# Patient Record
Sex: Female | Born: 2000 | Race: Black or African American | Hispanic: No | Marital: Single | Smoking: Never smoker
Health system: Southern US, Community
[De-identification: ages and names within clinical notes are randomized; demographics above are authoritative.]

## PROBLEM LIST (undated history)

## (undated) DIAGNOSIS — G43909 Migraine, unspecified, not intractable, without status migrainosus: Secondary | ICD-10-CM

## (undated) DIAGNOSIS — R519 Headache, unspecified: Secondary | ICD-10-CM

## (undated) DIAGNOSIS — R51 Headache: Secondary | ICD-10-CM

## (undated) DIAGNOSIS — K259 Gastric ulcer, unspecified as acute or chronic, without hemorrhage or perforation: Secondary | ICD-10-CM

## (undated) DIAGNOSIS — U071 COVID-19: Secondary | ICD-10-CM

## (undated) DIAGNOSIS — L309 Dermatitis, unspecified: Secondary | ICD-10-CM

## (undated) DIAGNOSIS — B052 Measles complicated by pneumonia: Secondary | ICD-10-CM

## (undated) DIAGNOSIS — K5900 Constipation, unspecified: Secondary | ICD-10-CM

## (undated) HISTORY — PX: NO PAST SURGERIES: SHX2092

## (undated) HISTORY — DX: Constipation, unspecified: K59.00

## (undated) HISTORY — DX: Measles complicated by pneumonia: B05.2

## (undated) HISTORY — DX: Headache, unspecified: R51.9

## (undated) HISTORY — DX: Migraine, unspecified, not intractable, without status migrainosus: G43.909

## (undated) HISTORY — DX: Headache: R51

## (undated) HISTORY — DX: Gastric ulcer, unspecified as acute or chronic, without hemorrhage or perforation: K25.9

---
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
## 2020-07-01 IMAGING — CT CT ABD-PELV W/ CM
2 of 4 series · 16 of 46 positions shown, 18 images · IV contrast (APPLIED)
Comparison: None.

CLINICAL DATA: 18-year-old female with acute abdominal pain and
blood in stool.

EXAM:
CT ABDOMEN AND PELVIS WITH CONTRAST
TECHNIQUE: Multidetector CT imaging of the abdomen and pelvis was performed
using the standard protocol following bolus administration of
intravenous contrast.
CONTRAST:  100mL OMNIPAQUE IOHEXOL 300 MG/ML  SOLN

[Series 3: abd/ pelvis 5.0 i30f 2 · axial · 0.83mm/px · z∈[+731,+1176]mm · 13 of 99 slices shown, 15 images]
[im 5/99  soft-tissue]
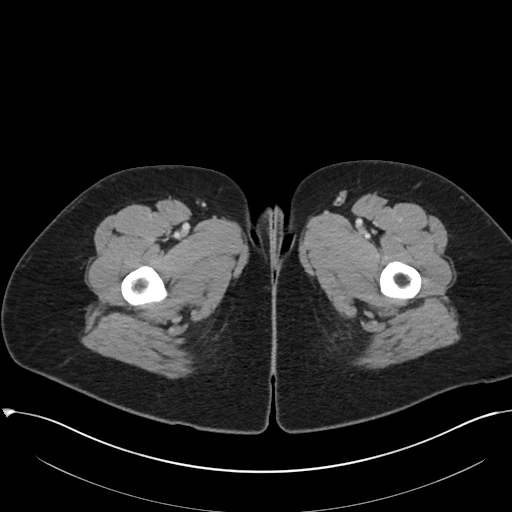
[im 5/99  bone]
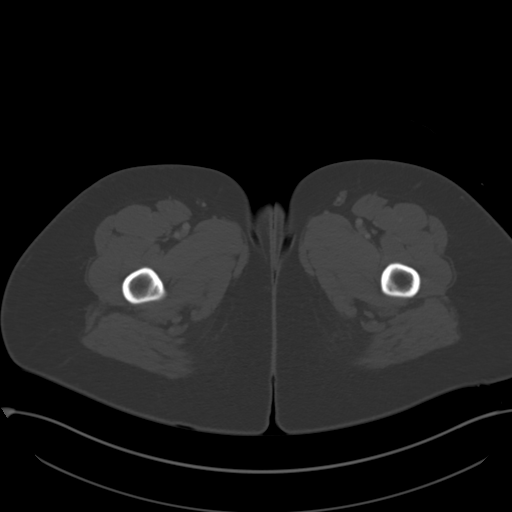
[im 13/99  soft-tissue]
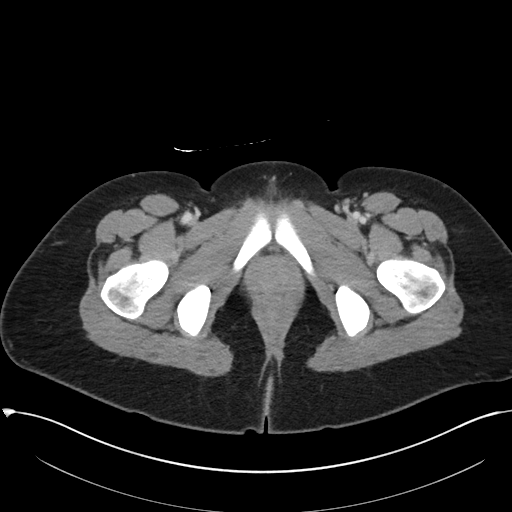
[im 22/99  soft-tissue]
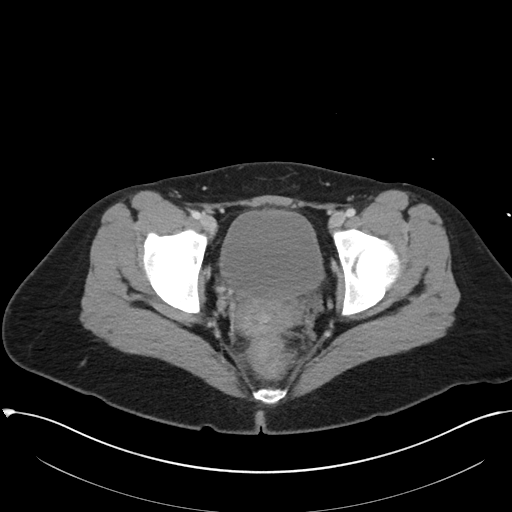
[im 26/99  soft-tissue]
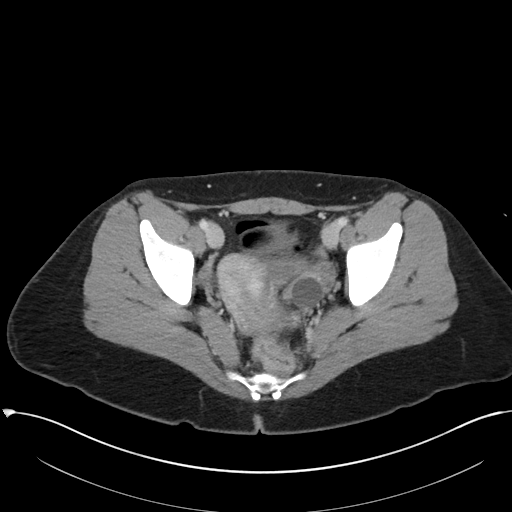
[im 35/99  soft-tissue]
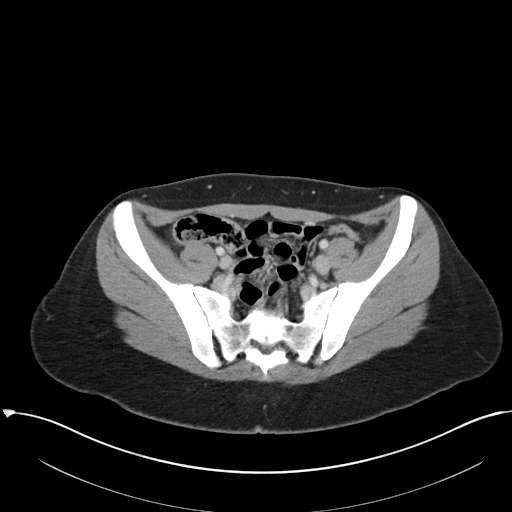
[im 43/99  soft-tissue]
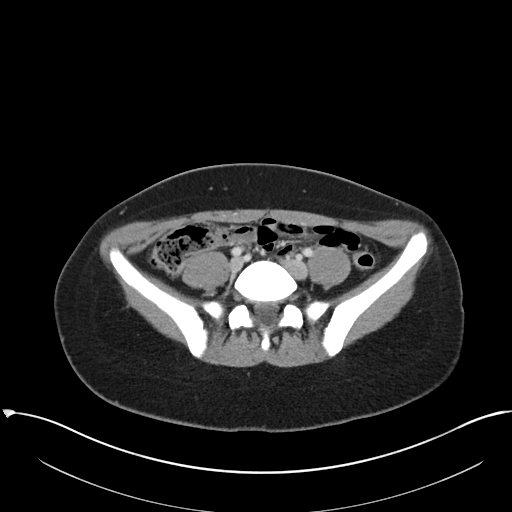
[im 52/99  soft-tissue]
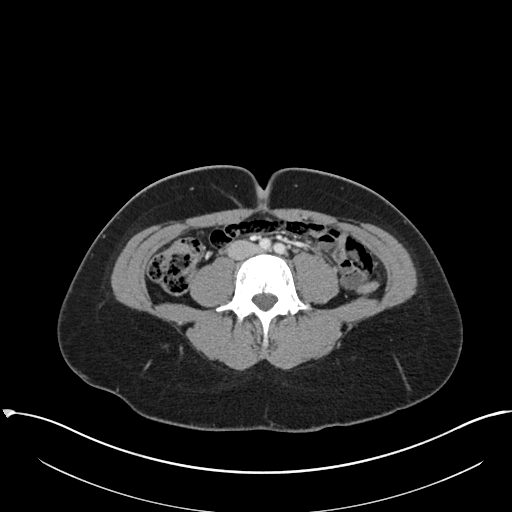
[im 56/99  soft-tissue]
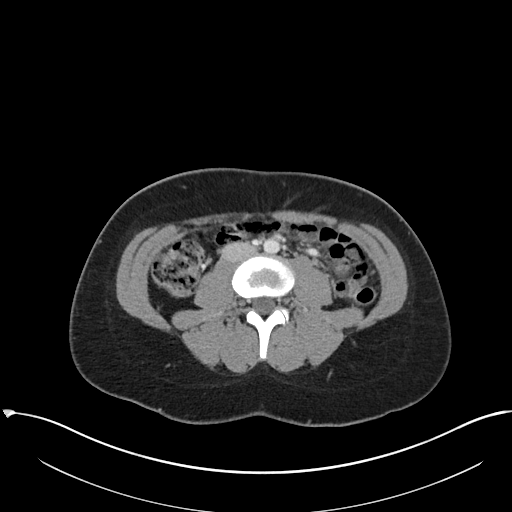
[im 64/99  soft-tissue]
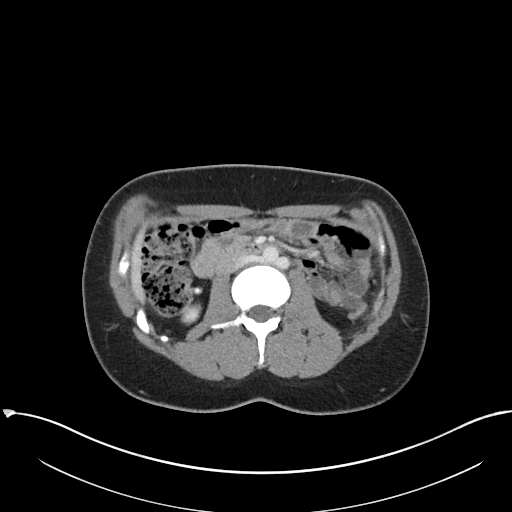
[im 64/99  bone]
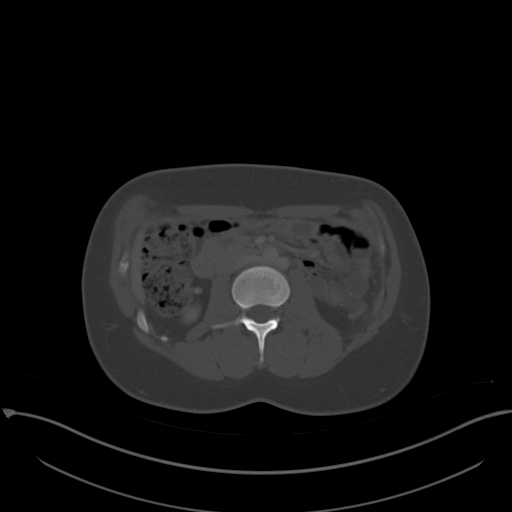
[im 73/99  soft-tissue]
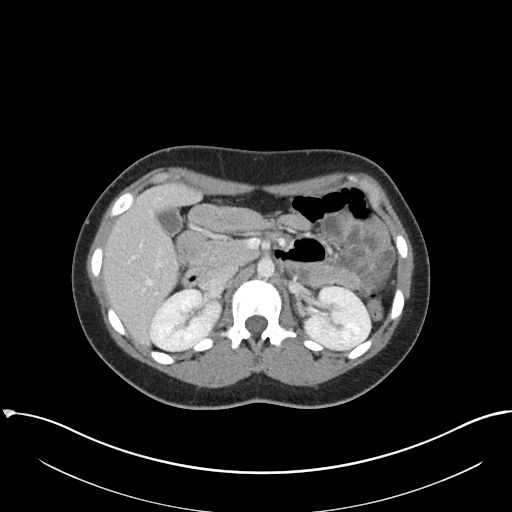
[im 77/99  soft-tissue]
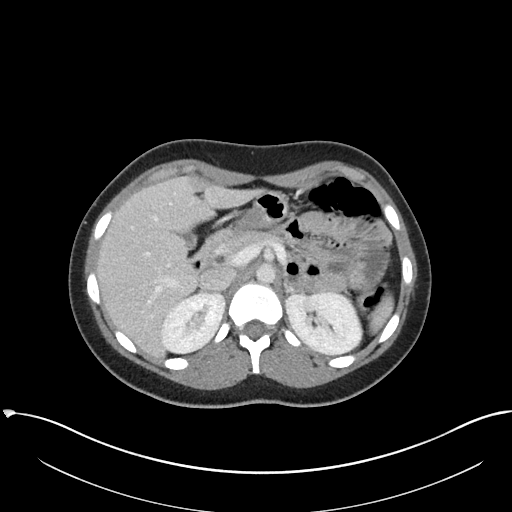
[im 86/99  soft-tissue]
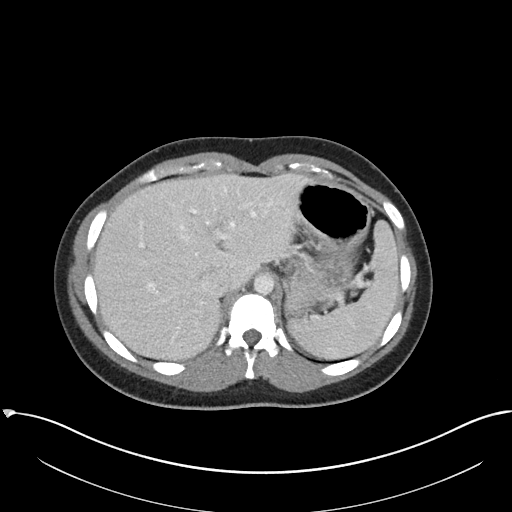
[im 94/99  soft-tissue]
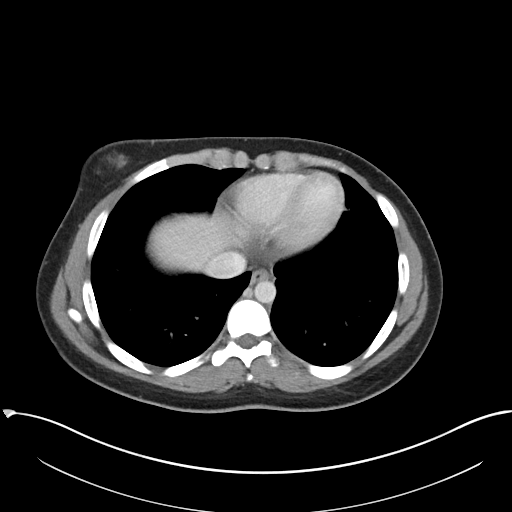

[Series 6: coronal soft tissue · coronal · 0.67mm/px · 3 of 77 slices shown]
[im 26/77  soft-tissue]
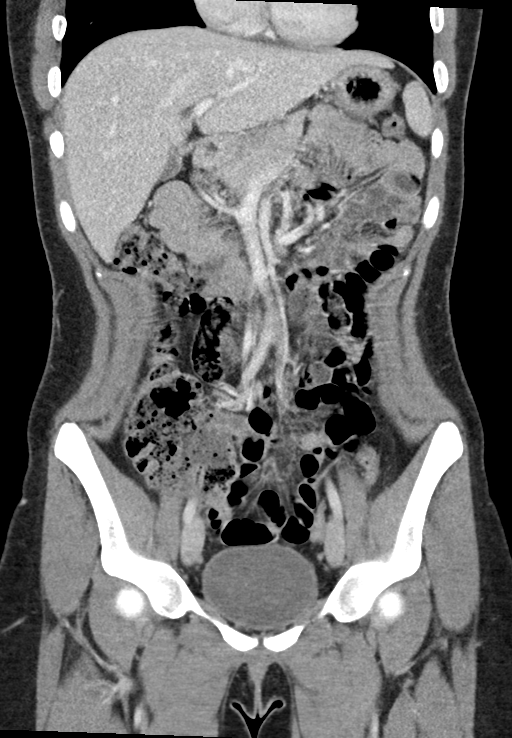
[im 34/77  soft-tissue]
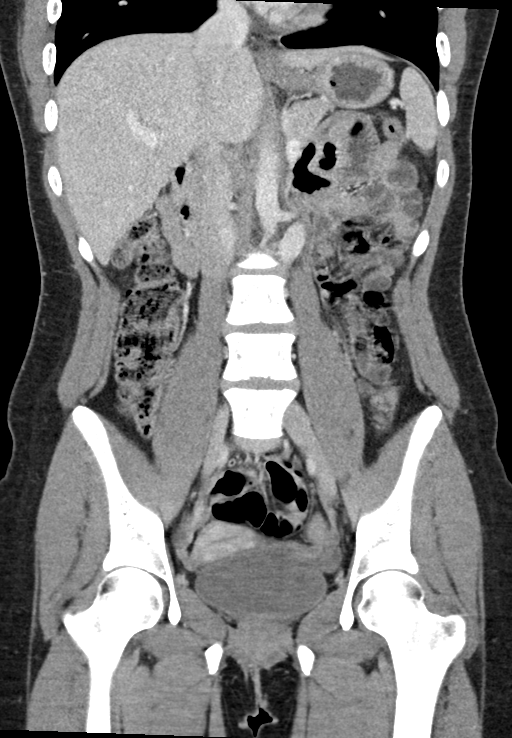
[im 43/77  soft-tissue]
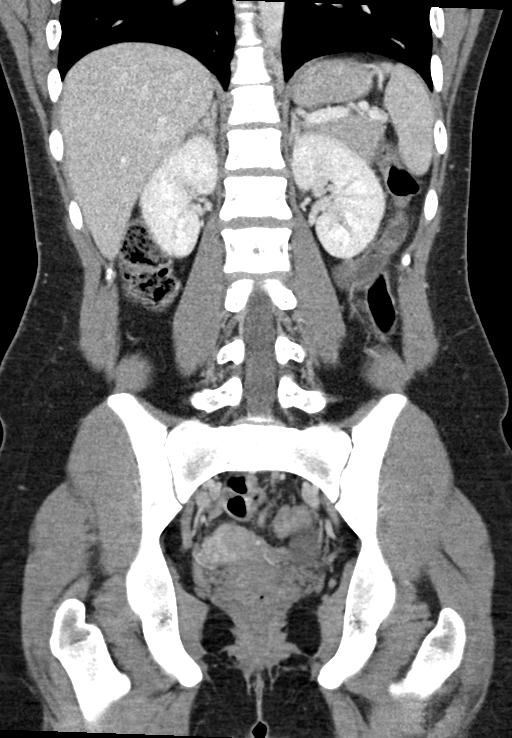

[16 of 46 positions shown; findings below may reference images not displayed]

FINDINGS: Lower chest: Unremarkable

Hepatobiliary: The liver and gallbladder are unremarkable. No
biliary dilatation.

Pancreas: Unremarkable

Spleen: Unremarkable

Adrenals/Urinary Tract: The kidneys, adrenal glands and bladder are
unremarkable.

Stomach/Bowel: Stomach is within normal limits. Appendix appears
normal. No evidence of bowel wall thickening, distention, or
inflammatory changes.

Vascular/Lymphatic: No significant vascular findings are present. No
enlarged abdominal or pelvic lymph nodes.

Reproductive: Uterus and bilateral adnexa are unremarkable.

Other: No ascites, abscess or pneumoperitoneum.

Musculoskeletal: No acute or significant abnormalities noted.
IMPRESSION: Unremarkable CT of the abdomen and pelvis with contrast.

## 2020-07-12 ENCOUNTER — Ambulatory Visit: Payer: Medicaid Other

## 2021-01-11 ENCOUNTER — Ambulatory Visit (HOSPITAL_COMMUNITY)
Admission: EM | Admit: 2021-01-11 | Discharge: 2021-01-11 | Disposition: A | Discharge status: Home / Self Care | Payer: Medicaid Other | Attending: Psychiatry | Admitting: Psychiatry

## 2021-01-11 ENCOUNTER — Other Ambulatory Visit: Payer: Self-pay

## 2021-01-11 DIAGNOSIS — J80 Acute respiratory distress syndrome: Secondary | ICD-10-CM | POA: Diagnosis not present

## 2021-01-11 DIAGNOSIS — R000 Tachycardia, unspecified: Secondary | ICD-10-CM | POA: Diagnosis not present

## 2021-01-11 DIAGNOSIS — R064 Hyperventilation: Secondary | ICD-10-CM | POA: Diagnosis not present

## 2021-01-11 DIAGNOSIS — F331 Major depressive disorder, recurrent, moderate: Secondary | ICD-10-CM | POA: Insufficient documentation

## 2021-01-11 DIAGNOSIS — F411 Generalized anxiety disorder: Secondary | ICD-10-CM | POA: Insufficient documentation

## 2021-01-11 DIAGNOSIS — R0689 Other abnormalities of breathing: Secondary | ICD-10-CM | POA: Diagnosis not present

## 2021-01-11 MED ORDER — HYDROXYZINE PAMOATE 25 MG PO CAPS: 25.0000 mg | ORAL_CAPSULE | Freq: Three times a day (TID) | ORAL | 0 refills | Status: DC | PRN

## 2021-01-11 MED ORDER — HYDROXYZINE HCL 25 MG PO TABS
25.0000 mg | ORAL_TABLET | Freq: Three times a day (TID) | ORAL | Status: DC | PRN
  Filled 2021-01-11: qty 21

## 2021-01-11 NOTE — ED Notes (Signed)
Pt discharged in no acute distress. Verbalized understanding of resources and medication samples given upon discharge.

## 2021-01-11 NOTE — Discharge Instructions (Signed)
° °  Please come to [facility] (this facility) during walk in hours for appointment with psychiatrist for further medication management and for therapists for therapy.  ° ° Walk in hours are 8-11 AM Monday through Thursday for medication management.Child and adolescent psychiatrists are only available on Wednesdays and Thursdays during walk in hours.  °Therapy walk in hours are Monday-Wednesday 8 AM-1PM.   It is first come, first -serve; it is best to arrive by 7:00 AM.  ° °On Friday from 1 pm to 4 pm for therapy intake only. Please arrive by 12:00 pm as it is  first come, first -serve.   ° °When you arrive please go upstairs for your appointment. If you are unsure of where to go, inform the front desk that you are here for a walk in appointment and they will assist you with directions upstairs. ° °Address:  °[address], in [place], [address] °Ph: [phone]  ° °

## 2021-01-11 NOTE — ED Provider Notes (Signed)
[facility] Medical Screening Exam  Patient Name: Shari Baker MRN: [id number] Date of Evaluation: 01/11/21 Chief Complaint:   Diagnosis:  Final diagnoses:  GAD (generalized anxiety disorder)  Moderate episode of recurrent major depressive disorder (HCC)    History of Present illness: Shari Baker is a 20 y.o. female with history of anxiety and depression who presents with her sister for evaluation of anxiety, depression, and panic attacks. Patient states that she presented to the [facility] today because " I'm having anxiety attacks". She state that this has been going on for 2 months, she denies any new or recent stressors in this time frame. She states that she completed a 4 year degree in 2 years and that there was some issues with her possibly having missed a class and not being able to graduate on time ; however, that has been resolved and she will graduate on time. She will graduate from [employer] with a degree in Counselling psychologist and will start a job in July. The job will be in [place] but she will be able to work remote  and will live with her sister who presented here with her today in [place]. She plans to reside in [place] until she graduates. She reports doing well in school. Pt reports that anxiety has recently been severe and that her mood as been depressed. She reports decreased appetite and has lost 30 lbs in the last 1 year. She reports hopelessness,decreased energy, isolation, anhedonia. She states that her grandmother passed away in 2021-05-22and that several other family members have passed away since then. She has not been to a Camera operator. She does not have a therapist or a psychiatrist. She states that her neurologist prescribed her venlafaxine which she has only took for 2 days prior to discontinuing because of side effects. She reports panic attacks occur frequently, will try and do breathing exercises but not always successful. She reports some feelings of passive  SI at times  but no plan or intent. No SI, active or passive reported during assessment. No HI/AVH. She reports looking forward to starting her job. Sister who is present with her reports no safety concerns.   Discussed open access hours at the [facility] as she is currently a [place] resident and has medicaid-she is interested in medication management and therapy. Discussed starting PRN vistaril 25 mg for anxiety- provided with 7 day sample and 30 day rx sent to pharmacy of choice.   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, civil (consulting) Ed: no Housing Status: with mom and dad Easy access to gun: no  Substance Use (with emphasis over the last 12 months) Recreational Drugs: denied Use of Alcohol: denied Tobacco Use: no Rehab History: no H/O Complicated Withdrawal: no  Legal History: Past Charges/Incarcerations: no Pending charges: no  Family Psychiatric History: Sister with anxiety   Psychiatric Specialty Exam  Presentation  General Appearance:Appropriate for Environment; Casual  Eye Contact:Fair  Speech:Clear and Coherent; Normal Rate  Speech Volume:Normal  Handedness:No data recorded  Mood and Affect  Mood:Anxious  Affect:Appropriate; Congruent   Thought Process  Thought Processes:Coherent; Goal Directed; Linear  Descriptions of Associations:Intact  Orientation:Full (Time, Place and Person)  Thought Content:WDL    Hallucinations:None  Ideas of Reference:None  Suicidal Thoughts:No  Homicidal Thoughts:No   Sensorium  Memory:Immediate  Good; Recent Good; Remote Good  Judgment:Fair  Insight:Fair   Executive Functions  Concentration:Good  Attention Span:Good  Recall:Good  Fund  of Knowledge:Good  Language:Good   Psychomotor Activity  Psychomotor Activity:Normal   Assets  Assets:Communication Skills; Desire for Improvement; Vocational/Educational; Social Support; Resilience; Housing   Sleep  Sleep:Poor (inconsistent; anywhere from 3 hours to "all day")  Number of hours: No data recorded  No data recorded  Physical Exam: Physical Exam Constitutional:      Appearance: Normal appearance. She is normal weight.  HENT:     Head: Normocephalic and atraumatic.  Pulmonary:     Effort: Pulmonary effort is normal.  Neurological:     Mental Status: She is alert and oriented to person, place, and time.    Review of Systems  Constitutional: Negative for chills and fever.  HENT: Negative for hearing loss.   Eyes: Negative for discharge and redness.  Respiratory: Negative for cough.   Cardiovascular: Negative for chest pain.  Gastrointestinal: Negative for abdominal pain.  Musculoskeletal: Negative for myalgias.  Neurological: Positive for headaches.   Blood pressure 115/79, pulse 81, temperature 98.7 F (37.1 C), temperature source Oral, resp. rate 16, SpO2 100 %. There is no height or weight on file to calculate BMI.  Musculoskeletal: Strength & Muscle Tone: within normal limits Gait & Station: normal Patient leans: N/A   [facility] MSE Discharge Disposition for Follow up and Recommendations: Based on my evaluation the patient does not appear to have an emergency medical condition and can be discharged with resources and follow up care in outpatient services for Medication Management and Individual Therapy   Patient provided with 7 day samples of vistaril 25 mg PRN TID for anxiety and sent prescription for 30 day rx to pharmacy of choice   Estella Husk, MD 01/11/2021, 3:30 PM

## 2021-01-11 NOTE — Progress Notes (Signed)
Shari Baker : ROUTINE: Patient is a 20 year old female that presents this date with her sister [name] [phone]. Patient denies any S/I, H/I or AVH. Patient was diagnosed with depression/anxiety over a year ago and prescribed medications at that time although patient cannot recall the name of that medication. Patient states she discontinued that medication two days due to side effects. Patient reports since then she has been having sporadic panic attacks (once or two a week for the last six months) with last episode yesterday when she contacted EMS due to SOB, feeling overwhelmed and extreme anxiety. Patient denies any prior attempts to self harm or SA issues. Patient reports decreased sleep for the last year although states she has only been sleeping 4 hours or less for the last month. Patient is requesting to be evaluated for possible medication interventions for symptom management.

## 2021-01-15 ENCOUNTER — Ambulatory Visit (HOSPITAL_COMMUNITY): Payer: Medicaid Other | Admitting: Licensed Clinical Social Worker

## 2021-01-16 ENCOUNTER — Ambulatory Visit (INDEPENDENT_AMBULATORY_CARE_PROVIDER_SITE_OTHER): Payer: Medicaid Other | Admitting: Physician Assistant

## 2021-01-16 ENCOUNTER — Other Ambulatory Visit: Payer: Self-pay

## 2021-01-16 ENCOUNTER — Encounter (HOSPITAL_COMMUNITY): Payer: Self-pay | Admitting: Physician Assistant

## 2021-01-16 VITALS — BP 118/52 | HR 97 | Ht 69.0 in | Wt 153.0 lb

## 2021-01-16 DIAGNOSIS — F331 Major depressive disorder, recurrent, moderate: Secondary | ICD-10-CM | POA: Diagnosis not present

## 2021-01-16 DIAGNOSIS — F411 Generalized anxiety disorder: Secondary | ICD-10-CM | POA: Insufficient documentation

## 2021-01-16 DIAGNOSIS — F410 Panic disorder [episodic paroxysmal anxiety] without agoraphobia: Secondary | ICD-10-CM

## 2021-01-16 MED ORDER — FLUOXETINE HCL 10 MG PO CAPS: 10.0000 mg | ORAL_CAPSULE | Freq: Every day | ORAL | 1 refills | Status: DC

## 2021-01-16 NOTE — Progress Notes (Signed)
Psychiatric Initial Adult Assessment   Patient Identification: Shari Baker MRN:  [id number] Date of Evaluation:  01/16/2021 Referral Source: [facility] Chief Complaint:   Chief Complaint    New Patient (Initial Visit)     Visit Diagnosis:    ICD-10-CM   1. Moderate episode of recurrent major depressive disorder (HCC)  F33.1 FLUoxetine (PROZAC) 10 MG capsule  2. Generalized anxiety disorder  F41.1 FLUoxetine (PROZAC) 10 MG capsule  3. Panic disorder  F41.0 FLUoxetine (PROZAC) 10 MG capsule    History of Present Illness:    Shari Baker is a 20 year old female with a past psychiatric history significant for anxiety and depression who [facility].  Patient reports that she is trying to receive medications for the management of her anxiety and depressive symptoms.  She reports that she has been doing some research on SSRIs and is interested in being placed on fluoxetine.  Patient reports that she was placed on an antidepressant a year ago but experienced terrible side effects and was taken to the emergency room.  The medication the patient was placed on was Effexor.  The side effects she experienced were racing thoughts, pacing and holding her head, intractable crying, hyperactivity, and the thought that she was going crazy.  Patient was taken off Effexor shortly after being admitted to the hospital.  Patient reports that she has been experiencing anxiety for roughly 2 months.  Patient reports that she also experienced a panic attack a month ago.  Patient's panic attacks are characterized by bad chest pains, shortness of breath, heart pounding, and racing thoughts.  Triggers to her anxiety include thinking about her stressors and planning out her future.  Patient rates her current anxiety a 9 out of 10.  Patient's stressors include school (patient was recently taking 21 credit hours), recent  acquisition of a new job, and some relationship issues.  [facility] after experiencing a bad panic attack and was prescribed Hydroxyzine 25 mg three times daily as needed for the management of her anxiety.  Patient feels that she has recently changed in regards to her personality.  Patient states that she used to be social and outgoing but now she has lost interest in specific activities and doesn't go out anymore. Patient also expresses newly developing test anxiety.  Patient endorses the following depressive symptoms: depressed mood, self-isolation, fluctuating sleep, lack of energy, lack of motivation, decreased appetite, and weight loss.  Alleviating factors to patient's symptoms include talking to someone in her family, watching uplifting TV shows, and stepping out for fresh air.  A GAD-7 screen was performed with the patient scoring a teen.  A PHQ-9 screen was also performed with the patient scoring a 21.  Patient is pleasant, calm, cooperative, and fully engaged in conversation during the encounter.  Patient denies suicidal or homicidal ideations.  She further denies auditory or visual hallucinations.  Patient endorses fluctuating sleep and states that sometimes she may receive a fair amount of sleep (4 to 5 hours) or a lot of sleep.  Patient endorses decreased appetite and states that she eats roughly 1 meal per day.  Patient denies alcohol consumption, tobacco use, and illicit drug use.   Associated Signs/Symptoms: Depression Symptoms:  depressed mood, anhedonia, insomnia, hypersomnia, psychomotor agitation, psychomotor retardation, fatigue, feelings of worthlessness/guilt, difficulty concentrating, hopelessness, impaired memory, anxiety, panic attacks, loss of energy/fatigue, disturbed sleep, weight loss, decreased appetite, (Hypo) Manic  Symptoms:  Distractibility, Flight of Ideas, Licensed conveyancer, Irritable Mood, Labiality  of Mood, Anxiety Symptoms:  Agoraphobia, Excessive Worry, Panic Symptoms, Social Anxiety, Specific Phobias, Psychotic Symptoms:  Paranoia, PTSD Symptoms: Had a traumatic exposure:  Patient reports the loss of many family members. Patient states that she was closed to many of these family members. Patient states that she has blocked some memories. Had a traumatic exposure in the last month:  N/A Re-experiencing:  Flashbacks Intrusive Thoughts Nightmares Hypervigilance:  No Hyperarousal:  Emotional Numbness/Detachment Irritability/Anger Sleep Avoidance:  Decreased Interest/Participation Foreshortened Future  Past Psychiatric History:  Anxiety Depression  Previous Psychotropic Medications: Yes   Substance Abuse History in the last 12 months:  No.  Consequences of Substance Abuse: NA  Past Medical History:  Past Medical History:  Diagnosis Date  . Constipation   . COVID-19   . Eczema   . Gastric ulcer   . Headache   . Measles complicated by pneumonia ~ 51 months of age   in [place], hospitalized x 2 weeks  . Migraine     Past Surgical History:  Procedure Laterality Date  . NO PAST SURGERIES      Family Psychiatric History:  Mother - patient believes that her mother has depression that has not been diagnosed Sister - Depression and anxiety Brother - OCD  Family History:  Family History  Problem Relation Age of Onset  . Hypercholesterolemia Mother   . Hypertension Mother   . Thyroid disease Mother   . Hypercholesterolemia Father   . Prostate cancer Maternal Grandfather   . Diabetes Paternal Grandmother   . Diabetes Paternal Grandfather   . Diabetes Maternal Uncle   . Diabetes Maternal Aunt   . Migraines Neg Hx   . Seizures Neg Hx   . Depression Neg Hx   . Anxiety disorder Neg Hx   . ADD / ADHD Neg Hx   . Autism Neg Hx   . Bipolar disorder Neg Hx   . Schizophrenia Neg Hx   . Stomach cancer Neg Hx   . Colon cancer Neg Hx   . Pancreatic cancer Neg Hx      Social History:   Social History   Socioeconomic History  . Marital status: Single    Spouse name: Not on file  . Number of children: 0  . Years of education: student  . Highest education level: Not on file  Occupational History  . Occupation: Consulting civil engineer  Tobacco Use  . Smoking status: Never Smoker  . Smokeless tobacco: Never Used  Vaping Use  . Vaping Use: Never used  Substance and Sexual Activity  . Alcohol use: Never  . Drug use: Never  . Sexual activity: Never  Other Topics Concern  . Not on file  Social History Narrative   She lives with her parents and siblings.    No caffeine use.   Right-handed.   Social Determinants of Health   Financial Resource Strain: Not on file  Food Insecurity: Not on file  Transportation Needs: Not on file  Physical Activity: Not on file  Stress: Not on file  Social Connections: Not on file    Additional Social History:    Allergies:  No Known Allergies  Metabolic Disorder Labs: No results found for: HGBA1C, MPG No results found for: PROLACTIN No results found for: CHOL, TRIG, HDL, CHOLHDL, VLDL, LDLCALC No results found for: TSH  Therapeutic Level Labs: No results found for: LITHIUM No results found for: CBMZ No results found for: VALPROATE  Current Medications: Current Outpatient Medications  Medication Sig Dispense Refill  . cetirizine (ZYRTEC) 10 MG tablet Take 1 tablet (10 mg total) by mouth daily. 30 tablet 0  . FLUoxetine (PROZAC) 10 MG capsule Take 1 capsule (10 mg total) by mouth daily. 30 capsule 1  . hydrOXYzine (VISTARIL) 25 MG capsule Take 1 capsule (25 mg total) by mouth 3 (three) times daily as needed for anxiety. 90 capsule 0  . mefloquine (LARIAM) 250 MG tablet Take 1 tablet (250 mg total) by mouth every 7 (seven) days. 9 tablet 0  . pantoprazole (PROTONIX) 20 MG tablet Take 1 tablet (20 mg total) by mouth daily. 30 tablet 1  . sucralfate (CARAFATE) 1 g tablet Take 1 tablet (1 g total) by mouth 4 (four)  times daily -  with meals and at bedtime for 14 days. 56 tablet 0   No current facility-administered medications for this visit.    Musculoskeletal: Strength & Muscle Tone: within normal limits Gait & Station: normal Patient leans: N/A  Psychiatric Specialty Exam: Review of Systems  Psychiatric/Behavioral: Positive for decreased concentration and sleep disturbance. Negative for dysphoric mood, hallucinations, self-injury and suicidal ideas. The patient is nervous/anxious. The patient is not hyperactive.     Blood pressure (!) 118/52, pulse 97, height 5\' 9"  (1.753 m), weight 153 lb (69.4 kg), SpO2 100 %.Body mass index is 22.59 kg/m.  General Appearance: Well Groomed  Eye Contact:  Good  Speech:  Clear and Coherent and Normal Rate  Volume:  Normal  Mood:  Anxious and Depressed  Affect:  Congruent and Depressed  Thought Process:  Coherent and Goal Directed  Orientation:  Full (Time, Place, and Person)  Thought Content:  WDL and Logical  Suicidal Thoughts:  No  Homicidal Thoughts:  No  Memory:  Immediate;   Good Recent;   Good Remote;   Good  Judgement:  Good  Insight:  Good  Psychomotor Activity:  Normal  Concentration:  Concentration: Good and Attention Span: Good  Recall:  Good  Fund of Knowledge:Good  Language: Good  Akathisia:  NA  Handed:  Right  AIMS (if indicated):  not done  Assets:  Communication Skills Desire for Improvement Housing Social Support Vocational/Educational  ADL's:  Intact  Cognition: WNL  Sleep:  Fair   Screenings: GAD-7   Flowsheet Row Office Visit from [facility]/16/2021 in [facility]  Total GAD-7 Score 19    PHQ2-9   Flowsheet Row Office Visit from 01/16/2021 in [place]  PHQ-2 Total Score 6  PHQ-9 Total Score 21    Flowsheet Row Office Visit from 01/16/2021 in [facility]  C-SSRS RISK CATEGORY No Risk      Assessment and Plan:   Chariah Zaugg is a  20 year old female with a past psychiatric history significant for anxiety and depression who presents to [facility] for psychiatric evaluation.  Patient is interested in being placed on an antidepressant for the management of her anxiety and depressive symptoms.  Patient endorses the following depressive symptoms: depressed mood, lack of interest in activities, self-isolation, disturbed sleep, lack of energy, lack of motivation, decreased appetite, and weight loss.  Patient also endorses panic attacks and states that she was assessed at [facility] recently after experiencing a very bad panic attack.  Patient was placed on hydroxyzine 25 mg 3 times daily as needed after being assessed at [facility].  Patient was recommended being placed on fluoxetine 10 mg daily  for the management of her anxiety, panic disorder, and depressive symptoms.  Patient was agreeable to recommendation.  Patient was advised to look out for the following symptoms upon starting the medication: Hyperactivity, racing thoughts, intractable crying spells, and insomnia. Patient was instructed to reach out to 911 or [facility] services in the event of experiencing manic symptoms. Patient's medications to be e-prescribed to pharmacy of choice.  1. Moderate episode of recurrent major depressive disorder (HCC)  - FLUoxetine (PROZAC) 10 MG capsule; Take 1 capsule (10 mg total) by mouth daily.  Dispense: 30 capsule; Refill: 1  2. Generalized anxiety disorder Patient to continue taking hydroxyzine 25 mg 3 times daily as needed for the management of her anxiety  - FLUoxetine (PROZAC) 10 MG capsule; Take 1 capsule (10 mg total) by mouth daily.  Dispense: 30 capsule; Refill: 1  3. Panic disorder  - FLUoxetine (PROZAC) 10 MG capsule; Take 1 capsule (10 mg total) by mouth daily.  Dispense: 30 capsule; Refill: 1  Patient to follow-up in 6 weeks  Meta Hatchet, PA 5/11/20228:58 PM

## 2021-01-17 ENCOUNTER — Encounter (HOSPITAL_COMMUNITY): Payer: Self-pay | Admitting: Physician Assistant

## 2021-01-23 DIAGNOSIS — R0981 Nasal congestion: Secondary | ICD-10-CM | POA: Diagnosis not present

## 2021-01-23 DIAGNOSIS — R059 Cough, unspecified: Secondary | ICD-10-CM | POA: Diagnosis not present

## 2021-01-23 DIAGNOSIS — Z20822 Contact with and (suspected) exposure to covid-19: Secondary | ICD-10-CM | POA: Diagnosis not present

## 2021-01-23 DIAGNOSIS — J0100 Acute maxillary sinusitis, unspecified: Secondary | ICD-10-CM | POA: Diagnosis not present

## 2021-03-01 ENCOUNTER — Encounter (HOSPITAL_COMMUNITY): Payer: Medicaid Other | Admitting: Physician Assistant

## 2021-03-26 ENCOUNTER — Telehealth (HOSPITAL_COMMUNITY): Payer: Self-pay | Admitting: *Deleted

## 2021-03-26 ENCOUNTER — Other Ambulatory Visit (HOSPITAL_COMMUNITY): Payer: Self-pay | Admitting: Physician Assistant

## 2021-03-26 DIAGNOSIS — F331 Major depressive disorder, recurrent, moderate: Secondary | ICD-10-CM

## 2021-03-26 DIAGNOSIS — F411 Generalized anxiety disorder: Secondary | ICD-10-CM

## 2021-03-26 DIAGNOSIS — F410 Panic disorder [episodic paroxysmal anxiety] without agoraphobia: Secondary | ICD-10-CM

## 2021-03-26 MED ORDER — FLUOXETINE HCL 10 MG PO CAPS: 10.0000 mg | ORAL_CAPSULE | Freq: Every day | ORAL | 1 refills | Status: DC

## 2021-03-26 NOTE — Progress Notes (Signed)
Provider was contacted by Direce E McIntyre, RMA regarding medication refill. Patient's medication to be e-prescribed to pharmacy of choice.

## 2021-03-26 NOTE — Telephone Encounter (Signed)
Provider was contacted by Direce E McIntyre, RMA regarding medication refill. Patient's medication to be e-prescribed to pharmacy of choice.

## 2021-03-26 NOTE — Telephone Encounter (Signed)
Rx REFILL REQUEST : FLUoxetine (PROZAC) 10 MG capsule

## 2021-04-04 ENCOUNTER — Telehealth (HOSPITAL_COMMUNITY): Payer: Medicaid Other | Admitting: Physician Assistant

## 2021-04-08 ENCOUNTER — Telehealth (INDEPENDENT_AMBULATORY_CARE_PROVIDER_SITE_OTHER): Payer: Medicaid Other | Admitting: Physician Assistant

## 2021-04-08 DIAGNOSIS — F411 Generalized anxiety disorder: Secondary | ICD-10-CM | POA: Diagnosis not present

## 2021-04-08 DIAGNOSIS — F410 Panic disorder [episodic paroxysmal anxiety] without agoraphobia: Secondary | ICD-10-CM

## 2021-04-08 DIAGNOSIS — F331 Major depressive disorder, recurrent, moderate: Secondary | ICD-10-CM

## 2021-04-08 MED ORDER — FLUOXETINE HCL 20 MG PO CAPS: 20.0000 mg | ORAL_CAPSULE | Freq: Every day | ORAL | 1 refills | Status: DC

## 2021-04-08 MED ORDER — HYDROXYZINE PAMOATE 25 MG PO CAPS: 25.0000 mg | ORAL_CAPSULE | Freq: Three times a day (TID) | ORAL | 1 refills | Status: DC | PRN

## 2021-04-08 NOTE — Progress Notes (Signed)
BH MD/PA/NP OP Progress Note  Virtual Visit via Telephone Note  I connected with Shari Baker on 04/10/21 at  3:30 PM EDT by telephone and verified that I am speaking with the correct person using two identifiers.  Location: Patient: Home Provider: Working remotely   I discussed the limitations, risks, security and privacy concerns of performing an evaluation and management service by telephone and the availability of in person appointments. I also discussed with the patient that there may be a patient responsible charge related to this service. The patient expressed understanding and agreed to proceed.  Follow Up Instructions:  I discussed the assessment and treatment plan with the patient. The patient was provided an opportunity to ask questions and all were answered. The patient agreed with the plan and demonstrated an understanding of the instructions.   The patient was advised to call back or seek an in-person evaluation if the symptoms worsen or if the condition fails to improve as anticipated.  I provided 25 minutes of non-face-to-face time during this encounter.  Meta Hatchet, PA    04/08/2021 4:48 PM Shari Baker  MRN:  [id number]  Chief Complaint: Follow up and medication management  HPI:   Shari Baker is a 20 year old female with a past psychiatric history significant major depressive disorder, generalized anxiety disorder, and panic disorder who presents to [facility] via virtual telephone visit for follow-up and medication management.  Patient is currently being managed on the following medications:  Fluoxetine 10 mg daily Hydroxyzine 25 mg 3 times daily as needed  Patient reports that she has been taking her medications routinely.  She expresses that her Prozac keeps her up at night stating that she sometimes stays up until 6 AM.  Patient was recommended taking her Prozac during the daytime.  Patient reports that her mood was pretty  good while taking Prozac up until 2 weeks ago.  Patient states that she is starting to reexperience panic attacks and has noticed that she is self isolating more.  Patient reports the following depressive symptoms: lack of motivation, depressed mood, and self-isolation.  Patient rates her anxiety at 3 out of 10 but states that it was much worse the previous week.  Patient states that she has also noticed a change in her speech.  Patient notes that she is speaking fast and starting to stutter.  She expresses that changes in her speech often occur with worsening anxiety.  Patient also endorses racing thoughts but denies hyperactivity, insomnia, or delusions of grandeur.  Patient attributes her anxiety and depressive symptoms to recently acquiring a new job in [place].  Patient reports that she will soon start having to move her stuff over to her new area.  Patient noted an improvement in her mood when visiting her sister.  A PHQ-9 screen was performed with the patient scoring a 16.  A GAD-7 screen was also performed with the patient scoring a 19.  Patient is calm, cooperative, and fully engaged in conversation during the encounter.  Patient denies suicidal or homicidal ideations.  She further denies auditory or visual hallucinations and does not appear to be responding to internal/external stimuli.  Patient endorses fair sleep and receives on average 7 hours of intermittent sleep.  Patient endorses change in appetite stating that she eats 1 meal with some occasional snacking.  Patient denies alcohol consumption, tobacco use, and illicit drug use.  Visit Diagnosis:    ICD-10-CM   1. Moderate episode of recurrent major depressive disorder (  HCC)  F33.1 FLUoxetine (PROZAC) 20 MG capsule    2. Generalized anxiety disorder  F41.1 hydrOXYzine (VISTARIL) 25 MG capsule    FLUoxetine (PROZAC) 20 MG capsule    3. Panic disorder  F41.0 FLUoxetine (PROZAC) 20 MG capsule      Past Psychiatric History:  Major depressive  disorder Generalized anxiety disorder Panic disorder  Past Medical History:  Past Medical History:  Diagnosis Date   Constipation    COVID-19    Eczema    Gastric ulcer    Headache    Measles complicated by pneumonia ~ 399 months of age   in [place], hospitalized x 2 weeks   Migraine     Past Surgical History:  Procedure Laterality Date   NO PAST SURGERIES      Family Psychiatric History:  Mother - patient believes that her mother has depression that has not been diagnosed Sister - Depression and anxiety Brother - OCD  Family History:  Family History  Problem Relation Age of Onset   Hypercholesterolemia Mother    Hypertension Mother    Thyroid disease Mother    Hypercholesterolemia Father    Prostate cancer Maternal Grandfather    Diabetes Paternal Grandmother    Diabetes Paternal Grandfather    Diabetes Maternal Uncle    Diabetes Maternal Aunt    Migraines Neg Hx    Seizures Neg Hx    Depression Neg Hx    Anxiety disorder Neg Hx    ADD / ADHD Neg Hx    Autism Neg Hx    Bipolar disorder Neg Hx    Schizophrenia Neg Hx    Stomach cancer Neg Hx    Colon cancer Neg Hx    Pancreatic cancer Neg Hx     Social History:  Social History   Socioeconomic History   Marital status: Single    Spouse name: Not on file   Number of children: 0   Years of education: student   Highest education level: Not on file  Occupational History   Occupation: student  Tobacco Use   Smoking status: Never   Smokeless tobacco: Never  Vaping Use   Vaping Use: Never used  Substance and Sexual Activity   Alcohol use: Never   Drug use: Never   Sexual activity: Never  Other Topics Concern   Not on file  Social History Narrative   She lives with her parents and siblings.    No caffeine use.   Right-handed.   Social Determinants of Health   Financial Resource Strain: Not on file  Food Insecurity: Not on file  Transportation Needs: Not on file  Physical Activity: Not on file   Stress: Not on file  Social Connections: Not on file    Allergies: No Known Allergies  Metabolic Disorder Labs: No results found for: HGBA1C, MPG No results found for: PROLACTIN No results found for: CHOL, TRIG, HDL, CHOLHDL, VLDL, LDLCALC No results found for: TSH  Therapeutic Level Labs: No results found for: LITHIUM No results found for: VALPROATE No components found for:  CBMZ  Current Medications: Current Outpatient Medications  Medication Sig Dispense Refill   cetirizine (ZYRTEC) 10 MG tablet Take 1 tablet (10 mg total) by mouth daily. 30 tablet 0   FLUoxetine (PROZAC) 20 MG capsule Take 1 capsule (20 mg total) by mouth daily. 30 capsule 1   hydrOXYzine (VISTARIL) 25 MG capsule Take 1 capsule (25 mg total) by mouth 3 (three) times daily as needed for anxiety. 75 capsule 1  mefloquine (LARIAM) 250 MG tablet Take 1 tablet (250 mg total) by mouth every 7 (seven) days. 9 tablet 0   pantoprazole (PROTONIX) 20 MG tablet Take 1 tablet (20 mg total) by mouth daily. 30 tablet 1   sucralfate (CARAFATE) 1 g tablet Take 1 tablet (1 g total) by mouth 4 (four) times daily -  with meals and at bedtime for 14 days. 56 tablet 0   No current facility-administered medications for this visit.     Musculoskeletal: Strength & Muscle Tone: Unable to assess due to telemedicine visit Gait & Station: Unable to assess due to telemedicine visit Patient leans: Unable to assess due to telemedicine visit  Psychiatric Specialty Exam: Review of Systems  Psychiatric/Behavioral:  Positive for sleep disturbance. Negative for decreased concentration, dysphoric mood, hallucinations, self-injury and suicidal ideas. The patient is nervous/anxious. The patient is not hyperactive.    There were no vitals taken for this visit.There is no height or weight on file to calculate BMI.  General Appearance: Unable to assess due to telemedicine visit  Eye Contact:  Unable to assess due to telemedicine visit  Speech:   Clear and Coherent and Normal Rate  Volume:  Normal  Mood:  Anxious and Depressed  Affect:  Congruent and Depressed  Thought Process:  Coherent, Goal Directed, and Descriptions of Associations: Intact  Orientation:  Full (Time, Place, and Person)  Thought Content: WDL   Suicidal Thoughts:  No  Homicidal Thoughts:  No  Memory:  Immediate;   Good Recent;   Good Remote;   Good  Judgement:  Good  Insight:  Good  Psychomotor Activity:  Normal  Concentration:  Concentration: Good and Attention Span: Good  Recall:  Good  Fund of Knowledge: Good  Language: Good  Akathisia:  NA  Handed:  Right  AIMS (if indicated): not done  Assets:  Communication Skills Desire for Improvement Housing Social Support Vocational/Educational  ADL's:  Intact  Cognition: WNL  Sleep:  Fair   Screenings: GAD-7    Flowsheet Row Video Visit from 04/08/2021 in [facility] Office Visit from 01/16/2021 in [facility]  Total GAD-7 Score 19 19      PHQ2-9    Flowsheet Row Video Visit from 04/08/2021 in [facility] Office Visit from 01/16/2021 in [facility]  PHQ-2 Total Score 5 6  PHQ-9 Total Score 16 21      Flowsheet Row Video Visit from 04/08/2021 in [facility] Office Visit from 01/16/2021 in [facility]  C-SSRS RISK CATEGORY No Risk No Risk        Assessment and Plan:   Ashawna Trani is a 20 year old female with a past psychiatric history significant major depressive disorder, generalized anxiety disorder, and panic disorder who presents to [facility] via virtual telephone visit for follow-up and medication management.  Patient notes depressive symptoms, worsening anxiety, and the reemergence of her panic attacks recently.  Patient has also noted changes in her speech that she attributes to worsening  anxiety.  Patient was originally taking Prozac 10 mg at bedtime but stated that she experienced insomnia.  Patient was recommended taking Prozac during the daytime.  Patient was also recommended increasing her dosage from 10 mg to 20 mg daily for the management of her depressive symptoms, anxiety, and panic attacks.  Patient was agreeable to recommendations.  Patient's medications to be e-prescribed to pharmacy of choice.  1. Moderate episode of recurrent major depressive disorder (HCC)  - FLUoxetine (PROZAC) 20 MG capsule; Take 1 capsule (20 mg total) by mouth daily.  Dispense: 30 capsule; Refill: 1  2. Generalized anxiety disorder  - hydrOXYzine (VISTARIL) 25 MG capsule; Take 1 capsule (25 mg total) by mouth 3 (three) times daily as needed for anxiety.  Dispense: 75 capsule; Refill: 1 - FLUoxetine (PROZAC) 20 MG capsule; Take 1 capsule (20 mg total) by mouth daily.  Dispense: 30 capsule; Refill: 1  3. Panic disorder  - FLUoxetine (PROZAC) 20 MG capsule; Take 1 capsule (20 mg total) by mouth daily.  Dispense: 30 capsule; Refill: 1  Patient to follow-up in 5 weeks Provider spent a total of 25 minutes with the patient/reviewing patient's chart  Meta Hatchet, PA 04/08/2021, 4:48 PM

## 2021-04-11 ENCOUNTER — Encounter (HOSPITAL_COMMUNITY): Payer: Self-pay | Admitting: Physician Assistant

## 2021-05-17 ENCOUNTER — Telehealth (HOSPITAL_COMMUNITY): Payer: Medicaid Other | Admitting: Physician Assistant

## 2021-06-07 ENCOUNTER — Telehealth (HOSPITAL_COMMUNITY): Payer: Medicaid Other | Admitting: Physician Assistant

## 2021-06-07 ENCOUNTER — Other Ambulatory Visit: Payer: Self-pay

## 2021-06-24 ENCOUNTER — Other Ambulatory Visit: Payer: Self-pay

## 2021-06-24 ENCOUNTER — Telehealth (HOSPITAL_COMMUNITY): Payer: Medicaid Other | Admitting: Physician Assistant

## 2021-08-15 ENCOUNTER — Telehealth (HOSPITAL_COMMUNITY): Payer: Medicaid Other | Admitting: Physician Assistant

## 2021-09-10 ENCOUNTER — Encounter (HOSPITAL_COMMUNITY): Payer: Self-pay

## 2021-09-10 ENCOUNTER — Telehealth (HOSPITAL_COMMUNITY): Payer: Medicaid Other | Admitting: Physician Assistant

## 2021-10-06 ENCOUNTER — Other Ambulatory Visit (HOSPITAL_COMMUNITY): Payer: Self-pay | Admitting: Physician Assistant

## 2021-10-06 DIAGNOSIS — F411 Generalized anxiety disorder: Secondary | ICD-10-CM

## 2021-10-06 DIAGNOSIS — F331 Major depressive disorder, recurrent, moderate: Secondary | ICD-10-CM

## 2021-10-06 DIAGNOSIS — F410 Panic disorder [episodic paroxysmal anxiety] without agoraphobia: Secondary | ICD-10-CM

## 2021-10-25 DIAGNOSIS — U071 COVID-19: Secondary | ICD-10-CM | POA: Diagnosis not present

## 2021-10-25 DIAGNOSIS — R059 Cough, unspecified: Secondary | ICD-10-CM | POA: Diagnosis not present

## 2021-10-25 DIAGNOSIS — R509 Fever, unspecified: Secondary | ICD-10-CM | POA: Diagnosis not present

## 2021-10-25 DIAGNOSIS — R070 Pain in throat: Secondary | ICD-10-CM | POA: Diagnosis not present

## 2022-03-31 ENCOUNTER — Emergency Department: Admit: 2022-03-31 | Payer: Medicaid (Managed Care)

## 2022-03-31 ENCOUNTER — Inpatient Hospital Stay
Admit: 2022-03-31 | Discharge: 2022-03-31 | Disposition: A | Discharge status: Home / Self Care | Payer: MEDICAID | Attending: Emergency Medicine

## 2022-03-31 ENCOUNTER — Emergency Department: Admit: 2022-03-31 | Payer: MEDICAID

## 2022-03-31 DIAGNOSIS — M25562 Pain in left knee: Secondary | ICD-10-CM

## 2022-03-31 LAB — CBC WITH AUTO DIFFERENTIAL
Absolute Immature Granulocyte: 0 10*3/uL (ref 0.00–0.04)
Basophils %: 0 % (ref 0–1)
Basophils Absolute: 0 10*3/uL (ref 0.0–0.1)
Eosinophils %: 4 % (ref 0–7)
Eosinophils Absolute: 0.3 10*3/uL (ref 0.0–0.4)
Hematocrit: 39.2 % (ref 35.0–47.0)
Hemoglobin: 12.7 g/dL (ref 11.5–16.0)
Immature Granulocytes: 0 % (ref 0–0.5)
Lymphocytes %: 39 % (ref 12–49)
Lymphocytes Absolute: 3.2 10*3/uL (ref 0.8–3.5)
MCH: 29.4 PG (ref 26.0–34.0)
MCHC: 32.4 g/dL (ref 30.0–36.5)
MCV: 90.7 FL (ref 80.0–99.0)
MPV: 11.6 FL (ref 8.9–12.9)
Monocytes %: 11 % (ref 5–13)
Monocytes Absolute: 0.9 10*3/uL (ref 0.0–1.0)
Neutrophils %: 46 % (ref 32–75)
Neutrophils Absolute: 3.7 10*3/uL (ref 1.8–8.0)
Nucleated RBCs: 0 PER 100 WBC
Platelets: 260 10*3/uL (ref 150–400)
RBC: 4.32 M/uL (ref 3.80–5.20)
RDW: 12.9 % (ref 11.5–14.5)
WBC: 8.1 10*3/uL (ref 3.6–11.0)
nRBC: 0 10*3/uL (ref 0.00–0.01)

## 2022-03-31 LAB — EKG 12-LEAD
Atrial Rate: 130 {beats}/min
P Axis: 73 degrees
P-R Interval: 142 ms
Q-T Interval: 288 ms
QRS Duration: 76 ms
QTc Calculation (Bazett): 423 ms
R Axis: 68 degrees
T Axis: 16 degrees
Ventricular Rate: 130 {beats}/min

## 2022-03-31 LAB — COMPREHENSIVE METABOLIC PANEL
ALT: 18 U/L (ref 12–78)
AST: 13 U/L — ABNORMAL LOW (ref 15–37)
Albumin/Globulin Ratio: 1.2 (ref 1.1–2.2)
Albumin: 3.9 g/dL (ref 3.5–5.0)
Alk Phosphatase: 82 U/L (ref 45–117)
Anion Gap: 4 mmol/L — ABNORMAL LOW (ref 5–15)
BUN: 11 mg/dL (ref 6–20)
Bun/Cre Ratio: 19 (ref 12–20)
CO2: 26 mmol/L (ref 21–32)
Calcium: 9.1 mg/dL (ref 8.5–10.1)
Chloride: 111 mmol/L — ABNORMAL HIGH (ref 97–108)
Creatinine: 0.57 mg/dL (ref 0.55–1.02)
Est, Glom Filt Rate: >60 mL/min/{1.73_m2} (ref >60)
Globulin: 3.3 g/dL (ref 2.0–4.0)
Glucose: 107 mg/dL — ABNORMAL HIGH (ref 65–100)
Potassium: 3.6 mmol/L (ref 3.5–5.1)
Sodium: 141 mmol/L (ref 136–145)
Total Bilirubin: 0.2 mg/dL (ref 0.2–1.0)
Total Protein: 7.2 g/dL (ref 6.4–8.2)

## 2022-03-31 LAB — TROPONIN: Troponin, High Sensitivity: 4 ng/L (ref 0–51)

## 2022-03-31 LAB — HCG, SERUM, QUALITATIVE: hCG Qual: NEGATIVE

## 2022-03-31 MED ORDER — DIPHENHYDRAMINE HCL 50 MG/ML IJ SOLN
50 MG/ML | Freq: Once | INTRAMUSCULAR | Status: DC
Start: 2022-03-31 — End: 2022-03-31
  Administered 2022-03-31: 09:00:00 50 mg via INTRAVENOUS

## 2022-03-31 MED ORDER — IOPAMIDOL 76 % IV SOLN
76 % | Freq: Once | INTRAVENOUS | Status: AC | PRN
Start: 2022-03-31 — End: 2022-03-31
  Administered 2022-03-31: 11:00:00 100 mL via INTRAVENOUS

## 2022-03-31 MED ORDER — ACETAMINOPHEN 500 MG PO TABS
500 MG | ORAL | Status: AC
Start: 2022-03-31 — End: 2022-03-31
  Administered 2022-03-31: 07:00:00 1000 mg via ORAL

## 2022-03-31 MED ORDER — SODIUM CHLORIDE 0.9 % IV BOLUS
0.9 % | Freq: Once | INTRAVENOUS | Status: AC
Start: 2022-03-31 — End: 2022-03-31
  Administered 2022-03-31: 10:00:00 1000 mL via INTRAVENOUS

## 2022-03-31 MED ORDER — IBUPROFEN 600 MG PO TABS
600 MG | ORAL_TABLET | Freq: Four times a day (QID) | ORAL | 0 refills | Status: AC | PRN
Start: 2022-03-31 — End: 2022-04-07

## 2022-03-31 MED ORDER — METHOCARBAMOL 750 MG PO TABS
750 MG | Freq: Once | ORAL | Status: DC
Start: 2022-03-31 — End: 2022-03-31

## 2022-03-31 MED ORDER — ACETAMINOPHEN 500 MG PO TABS
500 MG | ORAL_TABLET | Freq: Four times a day (QID) | ORAL | 0 refills | Status: AC | PRN
Start: 2022-03-31 — End: 2022-04-07

## 2022-03-31 MED ORDER — OXYCODONE HCL 5 MG PO TABS
5 MG | ORAL | Status: AC
Start: 2022-03-31 — End: 2022-03-31
  Administered 2022-03-31: 07:00:00 5 mg via ORAL

## 2022-03-31 MED ORDER — METHOCARBAMOL 750 MG PO TABS
750 MG | ORAL_TABLET | Freq: Three times a day (TID) | ORAL | 0 refills | Status: AC | PRN
Start: 2022-03-31 — End: 2022-04-07

## 2022-03-31 MED ORDER — DIPHENHYDRAMINE HCL 50 MG/ML IJ SOLN
50 MG/ML | INTRAMUSCULAR | Status: DC
Start: 2022-03-31 — End: 2022-03-31

## 2022-03-31 MED FILL — ISOVUE-370 76 % IV SOLN: 76 % | INTRAVENOUS | Qty: 100

## 2022-03-31 MED FILL — SODIUM CHLORIDE 0.9 % IV SOLN: 0.9 % | INTRAVENOUS | Qty: 1000

## 2022-03-31 NOTE — ED Notes (Signed)
After Benadryl IV given patient immediately c/o nausea, decrease in consciousness and shaking. Noted with increase in HR at 120 on monitor with b/p increased. This relayed to Dr. Donzetta Sprung, MD to bedside. New orders noted.        Dillard Cannon Westervelt, RN  03/31/22 [phone]
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
## 2022-03-31 NOTE — ED Notes (Signed)
Knee brace applied and pt instructed on how to ambulate with crutches. Pt demonstrated use of crutches     Murlean Iba, RN  03/31/22 [phone]

## 2022-03-31 NOTE — ED Notes (Signed)
To CT per stretcher with RT     Arnoldo Hooker, RN  03/31/22 [phone]

## 2022-03-31 NOTE — ED Notes (Signed)
To CT per wheelchair with RT.      Arnoldo Hooker, RN  03/31/22 0330

## 2022-03-31 NOTE — ED Notes (Signed)
Mother of patient is very concerned regarding patient's "soreness to her back and she has a rash to her left arm we want the doctor to see". Relayed request to Dr. Donzetta Sprung. Patient states her pain to the knee is gone with pain medication.      Dillard Cannon [place], RN  03/31/22 [phone]
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
## 2022-04-07 DIAGNOSIS — M25562 Pain in left knee: Secondary | ICD-10-CM | POA: Diagnosis not present

## 2023-01-01 ENCOUNTER — Telehealth: Payer: Self-pay

## 2023-01-01 NOTE — Telephone Encounter (Signed)
LVM for patient to call back. AS< CMA 

## 2023-04-22 ENCOUNTER — Other Ambulatory Visit: Payer: Self-pay

## 2023-04-22 ENCOUNTER — Encounter (HOSPITAL_BASED_OUTPATIENT_CLINIC_OR_DEPARTMENT_OTHER): Payer: Self-pay

## 2023-04-22 ENCOUNTER — Emergency Department (HOSPITAL_BASED_OUTPATIENT_CLINIC_OR_DEPARTMENT_OTHER)
Admission: EM | Admit: 2023-04-22 | Discharge: 2023-04-23 | Disposition: A | Discharge status: Home / Self Care | Payer: Managed Care, Other (non HMO) | Attending: Emergency Medicine | Admitting: Emergency Medicine

## 2023-04-22 DIAGNOSIS — K0889 Other specified disorders of teeth and supporting structures: Secondary | ICD-10-CM | POA: Insufficient documentation

## 2023-04-22 DIAGNOSIS — Z5321 Procedure and treatment not carried out due to patient leaving prior to being seen by health care provider: Secondary | ICD-10-CM | POA: Insufficient documentation

## 2023-04-22 NOTE — ED Triage Notes (Signed)
Pt arrived via POV c/o dental pain 8/10. Pt states that she has an abscess upper right side.

## 2023-04-23 NOTE — ED Notes (Signed)
Pt eloped from lobby per registration

## 2023-10-18 ENCOUNTER — Ambulatory Visit (INDEPENDENT_AMBULATORY_CARE_PROVIDER_SITE_OTHER): Payer: Federal, State, Local not specified - PPO

## 2023-10-18 ENCOUNTER — Ambulatory Visit (HOSPITAL_COMMUNITY)
Admission: EM | Admit: 2023-10-18 | Discharge: 2023-10-18 | Disposition: A | Discharge status: Home / Self Care | Payer: Federal, State, Local not specified - PPO | Attending: Internal Medicine | Admitting: Internal Medicine

## 2023-10-18 ENCOUNTER — Encounter (HOSPITAL_COMMUNITY): Payer: Self-pay | Admitting: Emergency Medicine

## 2023-10-18 DIAGNOSIS — R051 Acute cough: Secondary | ICD-10-CM

## 2023-10-18 DIAGNOSIS — J189 Pneumonia, unspecified organism: Secondary | ICD-10-CM

## 2023-10-18 MED ORDER — PROMETHAZINE-DM 6.25-15 MG/5ML PO SYRP: 5.0000 mL | ORAL_SOLUTION | Freq: Three times a day (TID) | ORAL | 0 refills | Status: AC | PRN

## 2023-10-18 MED ORDER — AZITHROMYCIN 250 MG PO TABS: 250.0000 mg | ORAL_TABLET | Freq: Every day | ORAL | 0 refills | Status: AC

## 2023-10-18 MED ORDER — PREDNISONE 20 MG PO TABS: 40.0000 mg | ORAL_TABLET | Freq: Every day | ORAL | 0 refills | Status: AC

## 2023-10-18 NOTE — Discharge Instructions (Addendum)
 Chest x-ray is clear on the right but shows possible pneumonia on the left base.  We will treat this with the following: Azithromycin  250mg  Take 2 tablets today and the 1 tablet daily for 4 more days. This is an antibiotic Promethazine  DM 5 mL every 8 hours as needed for cough.  Use caution as this medication can cause drowsiness.  Prednisone  40 mg (2 tablets) once daily for 5 days. Take this in the morning.  This is a steroid to help with inflammation and pain. Rest and stay hydrated.   Return to urgent care or PCP if symptoms worsen or fail to resolve.

## 2023-10-18 NOTE — ED Provider Notes (Signed)
 [facility]    CSN: [id number] Arrival date & time: 10/18/23  1028      History   Chief Complaint Chief Complaint  Patient presents with   Cough   Nasal Congestion   Headache   Generalized Body Aches   Sore Throat    HPI Shari Baker is a 23 y.o. female.   23 year old female who presents to urgent care with complaints of cough, chest pain, headaches, body aches, sore throat and fatigue.  She reports that this started about 3 days ago while she was in [place] on vacation.  She also feels like she is having fevers and chills.  The pain in her chest is specifically along the right flank and is much worse when she coughs or does any deep breathing in.  She has been using over-the-counter medication and also saw a physician while she was in [place] and given a medication called Cefaks which is a cephalosporin antibiotic.  She does not feel that this is helping at all.     Cough Associated symptoms: chest pain, chills, fever, headaches and shortness of breath   Associated symptoms: no ear pain, no rash and no sore throat   Headache Associated symptoms: congestion, cough, fatigue and fever   Associated symptoms: no abdominal pain, no back pain, no ear pain, no eye pain, no seizures, no sore throat and no vomiting   Sore Throat Associated symptoms include chest pain, headaches and shortness of breath. Pertinent negatives include no abdominal pain.    Past Medical History:  Diagnosis Date   Constipation    COVID-19    Eczema    Gastric ulcer    Headache    Measles complicated by pneumonia ~ 44 months of age   in [place], hospitalized x 2 weeks   Migraine     Patient Active Problem List   Diagnosis Date Noted   Moderate episode of recurrent major depressive disorder (HCC) 01/16/2021   Generalized anxiety disorder 01/16/2021   Panic disorder 01/16/2021   Unspecified mood (affective) disorder (HCC) 12/29/2019   Seasonal allergic rhinitis due to pollen 12/29/2019    COVID-19 08/12/2019   Right lower quadrant abdominal pain 07/28/2019   Nausea 07/28/2019   Rectal bleeding 07/28/2019   NSAID long-term use 07/28/2019   Chronic migraine 07/11/2019   Constipation 01/11/2019   Migraine without aura and with status migrainosus, not intractable 11/02/2016   Frequent headaches 12/20/2015   History of closed head injury 12/19/2015   Superficial inflammatory acne vulgaris 07/27/2013   History of foreign travel 07/27/2013   Irregular menstrual cycle 07/27/2013   Overweight 07/27/2013    Past Surgical History:  Procedure Laterality Date   NO PAST SURGERIES      OB History   No obstetric history on file.      Home Medications    Prior to Admission medications   Not on File    Family History Family History  Problem Relation Age of Onset   Hypercholesterolemia Mother    Hypertension Mother    Thyroid disease Mother    Hypercholesterolemia Father    Prostate cancer Maternal Grandfather    Diabetes Paternal Grandmother    Diabetes Paternal Grandfather    Diabetes Maternal Uncle    Diabetes Maternal Aunt    Migraines Neg Hx    Seizures Neg Hx    Depression Neg Hx    Anxiety disorder Neg Hx    ADD / ADHD Neg Hx    Autism Neg Hx  Bipolar disorder Neg Hx    Schizophrenia Neg Hx    Stomach cancer Neg Hx    Colon cancer Neg Hx    Pancreatic cancer Neg Hx     Social History Social History   Tobacco Use   Smoking status: Never   Smokeless tobacco: Never  Vaping Use   Vaping status: Never Used  Substance Use Topics   Alcohol use: Never   Drug use: Never     Allergies   Benadryl  [diphenhydramine ] and Oxycodone   Review of Systems Review of Systems  Constitutional:  Positive for appetite change, chills, fatigue and fever.  HENT:  Positive for congestion. Negative for ear pain and sore throat.   Eyes:  Negative for pain and visual disturbance.  Respiratory:  Positive for cough and shortness of breath.   Cardiovascular:   Positive for chest pain. Negative for palpitations.  Gastrointestinal:  Negative for abdominal pain and vomiting.  Genitourinary:  Negative for dysuria and hematuria.  Musculoskeletal:  Negative for arthralgias and back pain.  Skin:  Negative for color change and rash.  Neurological:  Positive for headaches. Negative for seizures and syncope.  All other systems reviewed and are negative.    Physical Exam Triage Vital Signs ED Triage Vitals  Encounter Vitals Group     BP 10/18/23 1058 120/73     Systolic BP Percentile --      Diastolic BP Percentile --      Pulse Rate 10/18/23 1058 (!) 106     Resp 10/18/23 1058 18     Temp 10/18/23 1058 98.7 F (37.1 C)     Temp Source 10/18/23 1058 Oral     SpO2 10/18/23 1058 97 %     Weight --      Height --      Head Circumference --      Peak Flow --      Pain Score 10/18/23 1102 10     Pain Loc --      Pain Education --      Exclude from Growth Chart --    No data found.  Updated Vital Signs BP 120/73 (BP Location: Right Arm)   Pulse (!) 106   Temp 98.7 F (37.1 C) (Oral)   Resp 18   LMP 10/04/2023 (Approximate)   SpO2 97%   Visual Acuity Right Eye Distance:   Left Eye Distance:   Bilateral Distance:    Right Eye Near:   Left Eye Near:    Bilateral Near:     Physical Exam Vitals and nursing note reviewed.  Constitutional:      General: She is not in acute distress.    Comments: Patient appears to not feel well  HENT:     Head: Normocephalic and atraumatic.  Eyes:     Conjunctiva/sclera: Conjunctivae normal.     Pupils: Pupils are equal, round, and reactive to light.  Cardiovascular:     Rate and Rhythm: Normal rate and regular rhythm.     Heart sounds: No murmur heard. Pulmonary:     Effort: Pulmonary effort is normal. No respiratory distress.     Breath sounds: Examination of the right-upper field reveals wheezing and rhonchi. Wheezing and rhonchi present. No decreased breath sounds.  Abdominal:      Palpations: Abdomen is soft.     Tenderness: There is no abdominal tenderness.  Musculoskeletal:        General: No swelling.     Cervical back: Neck supple.  Skin:  General: Skin is warm and dry.     Capillary Refill: Capillary refill takes less than 2 seconds.  Neurological:     Mental Status: She is alert.  Psychiatric:        Mood and Affect: Mood normal.      UC Treatments / Results  Labs (all labs ordered are listed, but only abnormal results are displayed) Labs Reviewed - No data to display  EKG   Radiology No results found.  Procedures Procedures (including critical care time)  Medications Ordered in UC Medications - No data to display  Initial Impression / Assessment and Plan / UC Course  I have reviewed the triage vital signs and the nursing notes.  Pertinent labs & imaging results that were available during my care of the patient were reviewed by me and considered in my medical decision making (see chart for details).     Acute cough - Plan: DG Chest 2 View, DG Chest 2 View  Pneumonia of left lower lobe due to infectious organism   Chest x-ray is clear on the right but shows possible pneumonia on the left base.  We will treat this with the following: Azithromycin  250mg  Take 2 tablets today and the 1 tablet daily for 4 more days. This is an antibiotic Promethazine  DM 5 mL every 8 hours as needed for cough.  Use caution as this medication can cause drowsiness.  Prednisone  40 mg (2 tablets) once daily for 5 days. Take this in the morning.  This is a steroid to help with inflammation and pain. Rest and stay hydrated.   Return to urgent care or PCP if symptoms worsen or fail to resolve.    Final Clinical Impressions(s) / UC Diagnoses   Final diagnoses:  None   Discharge Instructions   None    ED Prescriptions   None    PDMP not reviewed this encounter.   Teresa Almarie LABOR, [place] 10/18/23 1206

## 2023-10-18 NOTE — ED Triage Notes (Signed)
 Patient presents with c/o non productive cough, headache, sore throat, fatigue, chest discomfort on inspiration. Patient states she just arrived from [place] this morning. Patient states she has been alternating motrin  and tylenol and using Cefaks 500 mg every 12 hrs (from [place]).
